# Patient Record
Sex: Male | Born: 2000 | Race: Black or African American | Hispanic: No | Marital: Single | Smoking: Never smoker
Health system: Southern US, Community
[De-identification: ages and names within clinical notes are randomized; demographics above are authoritative.]

## PROBLEM LIST (undated history)

## (undated) HISTORY — PX: APPENDECTOMY: SHX54

---
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
## 2022-02-17 ENCOUNTER — Ambulatory Visit (INDEPENDENT_AMBULATORY_CARE_PROVIDER_SITE_OTHER): Payer: Self-pay

## 2022-02-17 ENCOUNTER — Encounter (HOSPITAL_COMMUNITY): Payer: Self-pay | Admitting: Emergency Medicine

## 2022-02-17 ENCOUNTER — Ambulatory Visit (HOSPITAL_COMMUNITY)
Admission: EM | Admit: 2022-02-17 | Discharge: 2022-02-17 | Disposition: A | Discharge status: Home / Self Care | Payer: Self-pay | Attending: Family Medicine | Admitting: Family Medicine

## 2022-02-17 ENCOUNTER — Other Ambulatory Visit: Payer: Self-pay

## 2022-02-17 DIAGNOSIS — M25572 Pain in left ankle and joints of left foot: Secondary | ICD-10-CM

## 2022-02-17 DIAGNOSIS — S60512A Abrasion of left hand, initial encounter: Secondary | ICD-10-CM

## 2022-02-17 NOTE — ED Provider Notes (Signed)
[facility]    CSN: [id number] Arrival date & time: 02/17/22  1041      History   Chief Complaint Chief Complaint  Patient presents with   Ankle Pain    HPI Jon Short is a 21 y.o. male.   Patient is here for left ankle/foot injury.  He was playing basketball yesterday, his shoe came off and landed oddly.  Not really sure how he landed, but had immediate pain and swelling and could not play.  Today the pain and swelling are worse.  Not really able to bear weight.   While hopping to the car today he fell on gravel and injured the left hand.  Abrasion to the palm.   History reviewed. No pertinent past medical history.  There are no problems to display for this patient.   Past Surgical History:  Procedure Laterality Date   APPENDECTOMY         Home Medications    Prior to Admission medications   Not on File    Family History Family History  Problem Relation Age of Onset   Healthy Mother     Social History Social History   Tobacco Use   Smoking status: Never   Smokeless tobacco: Never  Vaping Use   Vaping Use: Some days  Substance Use Topics   Alcohol use: Never   Drug use: Never     Allergies   Patient has no known allergies.   Review of Systems Review of Systems  Constitutional: Negative.   HENT: Negative.    Respiratory: Negative.    Cardiovascular: Negative.   Gastrointestinal: Negative.   Genitourinary: Negative.     Physical Exam Triage Vital Signs ED Triage Vitals  Enc Vitals Group     BP 02/17/22 1148 128/81     Pulse Rate 02/17/22 1148 85     Resp 02/17/22 1148 20     Temp 02/17/22 1148 97.7 F (36.5 C)     Temp Source 02/17/22 1148 Oral     SpO2 02/17/22 1148 98 %     Weight --      Height --      Head Circumference --      Peak Flow --      Pain Score 02/17/22 1143 3     Pain Loc --      Pain Edu? --      Excl. in GC? --    No data found.  Updated Vital Signs BP 128/81 (BP Location: Left Arm)  Comment (BP Location): large cuff  Pulse 85   Temp 97.7 F (36.5 C) (Oral)   Resp 20   SpO2 98%   Visual Acuity Right Eye Distance:   Left Eye Distance:   Bilateral Distance:    Right Eye Near:   Left Eye Near:    Bilateral Near:     Physical Exam Constitutional:      Appearance: Normal appearance.  Musculoskeletal:     Comments: The left foot and ankle are swollen. TTP to the lateral and medial malleolus;  TTP to the whole foot; no ecchymosis noted;  Decreased rom at the ankle and toes due to pain.   Skin:    Comments: At the left palm are several superficial abrasions;  there is one area that is bleeding minimally, portion of skin removed;   Neurological:     Mental Status: He is alert.  Psychiatric:        Mood and Affect: Mood normal.  UC Treatments / Results  Labs (all labs ordered are listed, but only abnormal results are displayed) Labs Reviewed - No data to display  EKG   Radiology DG Ankle Complete Left  Result Date: 02/17/2022 CLINICAL DATA:  Fall, pain to lateral ankle and foot EXAM: LEFT ANKLE COMPLETE - 3+ VIEW; LEFT FOOT - COMPLETE 3+ VIEW COMPARISON:  None Available. FINDINGS: Ankle: There is no acute fracture or dislocation. Alignment is normal. The ankle mortise is intact. There is no erosive change. There is mild soft tissue swelling over the lateral malleolus. Foot: There is no acute fracture or dislocation. Alignment is normal. The Lisfranc and Chopart joints are intact. The joint spaces are preserved. There is no erosive change. The soft tissues are unremarkable. IMPRESSION: Mild soft tissue swelling over the lateral malleolus. No acute fracture or dislocation in the ankle or foot. Electronically Signed   By: Valetta Mole M.D.   On: 02/17/2022 12:09   DG Foot Complete Left  Result Date: 02/17/2022 CLINICAL DATA:  Fall, pain to lateral ankle and foot EXAM: LEFT ANKLE COMPLETE - 3+ VIEW; LEFT FOOT - COMPLETE 3+ VIEW COMPARISON:  None Available.  FINDINGS: Ankle: There is no acute fracture or dislocation. Alignment is normal. The ankle mortise is intact. There is no erosive change. There is mild soft tissue swelling over the lateral malleolus. Foot: There is no acute fracture or dislocation. Alignment is normal. The Lisfranc and Chopart joints are intact. The joint spaces are preserved. There is no erosive change. The soft tissues are unremarkable. IMPRESSION: Mild soft tissue swelling over the lateral malleolus. No acute fracture or dislocation in the ankle or foot. Electronically Signed   By: Valetta Mole M.D.   On: 02/17/2022 12:09    Procedures Procedures (including critical care time)  Medications Ordered in UC Medications - No data to display  Initial Impression / Assessment and Plan / UC Course  I have reviewed the triage vital signs and the nursing notes.  Pertinent labs & imaging results that were available during my care of the patient were reviewed by me and considered in my medical decision making (see chart for details).    Final Clinical Impressions(s) / UC Diagnoses   Final diagnoses:  Pain of joint of left ankle and foot  Abrasion of palm of left hand, initial encounter     Discharge Instructions      You were seen today for foot and ankle injury.  Your xrays were negative for fracture.  We have given you an ace wrap today.  I recommend you stay off your foot, elevated it, apply ice, an use motrin for pain and swelling.  If you continue with pain and swelling you may need further testing.   Please go to [email or web address]  to find a primary care provider.   The abrasion on your hand was cleaned today and antibiotic ointment applied;  Please keep the area clean and covered.  You may apply over the counter bacitracin as well.     ED Prescriptions   None    PDMP not reviewed this encounter.   Rondel Oh, MD 02/17/22 1220

## 2022-02-17 NOTE — ED Triage Notes (Addendum)
Playing basketball yesterday.  Jumped in air and when he landed, shoe had come off.  Left foot, lateral aspect and lateral left ankle is painful.  Minimal movement of toes possibly secondary to pain, pedal pulse 2 +, visibly swollen.    Today, on his way here, patient fell trying to hop to car.  Landed on left palm on concrete.  Able to move left hand with out any difficulty.  Abrasion to palm

## 2022-02-17 NOTE — Discharge Instructions (Addendum)
You were seen today for foot and ankle injury.  Your xrays were negative for fracture.  We have given you an ace wrap today.  I recommend you stay off your foot, elevated it, apply ice, an use motrin for pain and swelling.  If you continue with pain and swelling you may need further testing.   Please go to [email or web address]  to find a primary care provider.   The abrasion on your hand was cleaned today and antibiotic ointment applied;  Please keep the area clean and covered.  You may apply over the counter bacitracin as well.

## 2022-07-25 ENCOUNTER — Encounter (HOSPITAL_COMMUNITY): Payer: Self-pay

## 2022-07-25 ENCOUNTER — Ambulatory Visit (INDEPENDENT_AMBULATORY_CARE_PROVIDER_SITE_OTHER): Payer: Self-pay

## 2022-07-25 ENCOUNTER — Ambulatory Visit (HOSPITAL_COMMUNITY)
Admission: EM | Admit: 2022-07-25 | Discharge: 2022-07-25 | Disposition: A | Discharge status: Home / Self Care | Payer: Self-pay | Attending: Family Medicine | Admitting: Family Medicine

## 2022-07-25 DIAGNOSIS — Y9361 Activity, american tackle football: Secondary | ICD-10-CM

## 2022-07-25 DIAGNOSIS — M25512 Pain in left shoulder: Secondary | ICD-10-CM

## 2022-07-25 MED ORDER — IBUPROFEN 800 MG PO TABS: 800.0000 mg | ORAL_TABLET | Freq: Three times a day (TID) | ORAL | 0 refills | Status: DC

## 2022-07-25 MED ORDER — TIZANIDINE HCL 4 MG PO TABS: 4.0000 mg | ORAL_TABLET | Freq: Four times a day (QID) | ORAL | 0 refills | Status: AC | PRN

## 2022-07-25 MED ORDER — KETOROLAC TROMETHAMINE 30 MG/ML IJ SOLN
30.0000 mg | Freq: Once | INTRAMUSCULAR | Status: AC
  Administered 2022-07-25: 30 mg via INTRAMUSCULAR

## 2022-07-25 MED ORDER — KETOROLAC TROMETHAMINE 30 MG/ML IJ SOLN
INTRAMUSCULAR | Status: AC
  Filled 2022-07-25: qty 1

## 2022-07-25 NOTE — ED Triage Notes (Signed)
Patient states he was hit while playing football and did not think anything of it. Last night pain in the left shoulder started, shooting pain when turning his head.  States this morning could not move neck or arm.  No previous injuries to the left shoulder.

## 2022-07-25 NOTE — ED Provider Notes (Signed)
[facility]    CSN: [id number] Arrival date & time: 07/25/22  0847      History   Chief Complaint Chief Complaint  Patient presents with   Shoulder Injury    HPI Jon Short is a 21 y.o. male.   He was hit playing football on Friday (3 days ago).   He was running and his cousin hit his shoulder from the front.  No immediate at that time.  He thinks it started hurting the next day.   It has gotten worse, this morning was painful to move the neck.  He is having pain around the whole left shoulder, and top of the shoulder.  He gets sharp pains at times at the shoulder with certain movements.  Pain if just hanging down.  He did take motrin for pain without much help.  He is Nature conservation officer at [employer].  He is right handed.        History reviewed. No pertinent past medical history.  There are no problems to display for this patient.   Past Surgical History:  Procedure Laterality Date   APPENDECTOMY         Home Medications    Prior to Admission medications   Not on File    Family History Family History  Problem Relation Age of Onset   Healthy Mother     Social History Social History   Tobacco Use   Smoking status: Never   Smokeless tobacco: Never  Vaping Use   Vaping Use: Some days  Substance Use Topics   Alcohol use: Never   Drug use: Never     Allergies   Patient has no known allergies.   Review of Systems Review of Systems  Constitutional: Negative.   HENT: Negative.    Respiratory: Negative.    Cardiovascular: Negative.   Gastrointestinal: Negative.   Genitourinary: Negative.   Skin: Negative.   Psychiatric/Behavioral: Negative.       Physical Exam Triage Vital Signs ED Triage Vitals  Enc Vitals Group     BP 07/25/22 0947 122/64     Pulse Rate 07/25/22 0947 71     Resp 07/25/22 0947 16     Temp 07/25/22 0947 98.1 F (36.7 C)     Temp Source 07/25/22 0947 Oral     SpO2 07/25/22 0947 98 %     Weight --      Height --       Head Circumference --      Peak Flow --      Pain Score 07/25/22 0946 10     Pain Loc --      Pain Edu? --      Excl. in GC? --    No data found.  Updated Vital Signs BP 122/64 (BP Location: Right Arm)   Pulse 71   Temp 98.1 F (36.7 C) (Oral)   Resp 16   SpO2 98%   Visual Acuity Right Eye Distance:   Left Eye Distance:   Bilateral Distance:    Right Eye Near:   Left Eye Near:    Bilateral Near:     Physical Exam Constitutional:      Appearance: Normal appearance.  Musculoskeletal:     Comments: Holds the left arm/shoulder near his left side without much movement.  TTP to the left trapezius from the neck to the mid back;  TTP to the anterior left shoulder and superior shoulder. Decreased ROM due to pain.  Exam is limited by the  amount of pain he is in  Skin:    General: Skin is warm.  Neurological:     General: No focal deficit present.     Mental Status: He is alert.  Psychiatric:        Mood and Affect: Mood normal.      UC Treatments / Results  Labs (all labs ordered are listed, but only abnormal results are displayed) Labs Reviewed - No data to display  EKG   Radiology DG Shoulder Left  Result Date: 07/25/2022 CLINICAL DATA:  Left shoulder pain following football injury EXAM: LEFT SHOULDER - 4 VIEW COMPARISON:  None Available. FINDINGS: There is no evidence of fracture or dislocation. There is no evidence of arthropathy or other focal bone abnormality. Soft tissues are unremarkable. IMPRESSION: No acute fracture or dislocation. Electronically Signed   By: Darrin Nipper M.D.   On: 07/25/2022 10:51    Procedures Procedures (including critical care time)  Medications Ordered in UC Medications - No data to display  Initial Impression / Assessment and Plan / UC Course  I have reviewed the triage vital signs and the nursing notes.  Pertinent labs & imaging results that were available during my care of the patient were reviewed by me and considered in  my medical decision making (see chart for details).     Final Clinical Impressions(s) / UC Diagnoses   Final diagnoses:  Pain in joint of left shoulder     Discharge Instructions      You were seen today for shoulder pain/injury.  Your xray was negative for fracture or dislocation.  You may have more muscular injury.  I am treating you with a muscle relaxer and high dose motrin to help with pain.  The muscle relaxer may make you sleepy so please take this when you are home and not driving.  You should keep you arm in the sling for comfort.  I recommend you use heat or ice at the shoulder/back, which ever feel better to you.  I am giving you off of work the next several days.  If not improving, then I recommend you see a primary care provider for further care and discussion.  You may go to [email or web address] to find one.     ED Prescriptions     Medication Sig Dispense Auth. Provider   tiZANidine (ZANAFLEX) 4 MG tablet Take 1 tablet (4 mg total) by mouth every 6 (six) hours as needed for muscle spasms. 30 tablet Sinia Antosh, MD   ibuprofen (ADVIL) 800 MG tablet Take 1 tablet (800 mg total) by mouth 3 (three) times daily. 21 tablet Rondel Oh, MD      PDMP not reviewed this encounter.   Rondel Oh, MD 07/25/22 1058

## 2022-07-25 NOTE — Discharge Instructions (Addendum)
You were seen today for shoulder pain/injury.  Your xray was negative for fracture or dislocation.  You may have more muscular injury.  I am treating you with a muscle relaxer and high dose motrin to help with pain.  The muscle relaxer may make you sleepy so please take this when you are home and not driving.  You should keep you arm in the sling for comfort.  I recommend you use heat or ice at the shoulder/back, which ever feel better to you.  I am giving you off of work the next several days.  If not improving, then I recommend you see a primary care provider for further care and discussion.  You may go to [email or web address] to find one.

## 2022-11-10 ENCOUNTER — Ambulatory Visit (HOSPITAL_COMMUNITY)
Admission: EM | Admit: 2022-11-10 | Discharge: 2022-11-10 | Disposition: A | Discharge status: Home / Self Care | Payer: Self-pay | Attending: Internal Medicine | Admitting: Internal Medicine

## 2022-11-10 ENCOUNTER — Encounter (HOSPITAL_COMMUNITY): Payer: Self-pay | Admitting: Emergency Medicine

## 2022-11-10 ENCOUNTER — Other Ambulatory Visit: Payer: Self-pay

## 2022-11-10 DIAGNOSIS — Z20822 Contact with and (suspected) exposure to covid-19: Secondary | ICD-10-CM

## 2022-11-10 DIAGNOSIS — Z1152 Encounter for screening for COVID-19: Secondary | ICD-10-CM | POA: Insufficient documentation

## 2022-11-10 LAB — SARS CORONAVIRUS 2 (TAT 6-24 HRS): SARS Coronavirus 2: NEGATIVE

## 2022-11-10 NOTE — ED Triage Notes (Signed)
Requesting test for covid.  Denies any symptoms.  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

## 2022-11-10 NOTE — Discharge Instructions (Signed)
COVID-19 testing is pending. We will call you with results if positive. If your COVID test is positive, you must stay at home until day 6 of symptoms. On day 6, you may go out into public and go back to work, but you must wear a mask until day 11 of symptoms to prevent spread to others.  Return to urgent care as needed.

## 2022-11-10 NOTE — ED Notes (Signed)
Covid test obtained labeled at bedside and placed in lab

## 2022-11-11 NOTE — ED Provider Notes (Signed)
[facility]    CSN: [id number] Arrival date & time: 11/10/22  1119      History   Chief Complaint Chief Complaint  Patient presents with   covid test    HPI Jon Short is a 22 y.o. male.   Patient presents urgent care for COVID-19 testing since he was recently exposed to someone in his household (mother) who recently tested positive for COVID-19.  He is not currently experiencing any symptoms but would like to be tested.  No history of chronic respiratory problems.  He states that he vapes sometimes, however not every day.  She denies cigarette and other drug use.  Currently without any other complaints.     History reviewed. No pertinent past medical history.  There are no problems to display for this patient.   Past Surgical History:  Procedure Laterality Date   APPENDECTOMY         Home Medications    Prior to Admission medications   Medication Sig Start Date End Date Taking? Authorizing Provider  ibuprofen (ADVIL) 800 MG tablet Take 1 tablet (800 mg total) by mouth 3 (three) times daily. 07/25/22   Piontek, Junie Panning, MD  tiZANidine (ZANAFLEX) 4 MG tablet Take 1 tablet (4 mg total) by mouth every 6 (six) hours as needed for muscle spasms. 07/25/22   Rondel Oh, MD    Family History Family History  Problem Relation Age of Onset   Healthy Mother     Social History Social History   Tobacco Use   Smoking status: Never   Smokeless tobacco: Never  Vaping Use   Vaping Use: Some days  Substance Use Topics   Alcohol use: Never   Drug use: Never     Allergies   Patient has no known allergies.   Review of Systems Review of Systems Per HPI  Physical Exam Triage Vital Signs ED Triage Vitals  Enc Vitals Group     BP 11/10/22 1209 129/83     Pulse Rate 11/10/22 1209 69     Resp 11/10/22 1209 18     Temp 11/10/22 1209 97.9 F (36.6 C)     Temp Source 11/10/22 1209 Oral     SpO2 11/10/22 1209 98 %     Weight --      Height --       Head Circumference --      Peak Flow --      Pain Score 11/10/22 1208 0     Pain Loc --      Pain Edu? --      Excl. in [place]? --    No data found.  Updated Vital Signs BP 129/83 (BP Location: Left Arm)   Pulse 69   Temp 97.9 F (36.6 C) (Oral)   Resp 18   SpO2 98%   Visual Acuity Right Eye Distance:   Left Eye Distance:   Bilateral Distance:    Right Eye Near:   Left Eye Near:    Bilateral Near:     Physical Exam Vitals and nursing note reviewed.  Constitutional:      Appearance: He is not ill-appearing or toxic-appearing.  HENT:     Head: Normocephalic and atraumatic.     Right Ear: Hearing and external ear normal.     Left Ear: Hearing and external ear normal.     Nose: Nose normal.     Mouth/Throat:     Lips: Pink.  Eyes:     General: Lids are normal.  Vision grossly intact. Gaze aligned appropriately.     Extraocular Movements: Extraocular movements intact.     Conjunctiva/sclera: Conjunctivae normal.  Cardiovascular:     Rate and Rhythm: Normal rate and regular rhythm.     Heart sounds: Normal heart sounds, S1 normal and S2 normal.  Pulmonary:     Effort: Pulmonary effort is normal. No respiratory distress.     Breath sounds: Normal breath sounds and air entry.  Musculoskeletal:     Cervical back: Neck supple.  Skin:    General: Skin is warm and dry.     Capillary Refill: Capillary refill takes less than 2 seconds.     Findings: No rash.  Neurological:     General: No focal deficit present.     Mental Status: He is alert and oriented to person, place, and time. Mental status is at baseline.     Cranial Nerves: No dysarthria or facial asymmetry.  Psychiatric:        Mood and Affect: Mood normal.        Speech: Speech normal.        Behavior: Behavior normal.        Thought Content: Thought content normal.        Judgment: Judgment normal.      UC Treatments / Results  Labs (all labs ordered are listed, but only abnormal results are  displayed) Labs Reviewed  SARS CORONAVIRUS 2 (TAT 6-24 HRS)    EKG   Radiology No results found.  Procedures Procedures (including critical care time)  Medications Ordered in UC Medications - No data to display  Initial Impression / Assessment and Plan / UC Course  I have reviewed the triage vital signs and the nursing notes.  Pertinent labs & imaging results that were available during my care of the patient were reviewed by me and considered in my medical decision making (see chart for details).   1.  Encounter for screening for COVID-19 COVID testing is pending, will call patient if positive.  He is not a candidate for antiviral therapy.  Quarantine guidelines discussed should he test positive for COVID-19.   Discussed physical exam and available lab work findings in clinic with patient.  Counseled patient regarding appropriate use of medications and potential side effects for all medications recommended or prescribed today. Discussed red flag signs and symptoms of worsening condition,when to call the PCP office, return to urgent care, and when to seek higher level of care in the emergency department. Patient verbalizes understanding and agreement with plan. All questions answered. Patient discharged in stable condition.    Final Clinical Impressions(s) / UC Diagnoses   Final diagnoses:  Encounter for screening for COVID-19     Discharge Instructions      COVID-19 testing is pending. We will call you with results if positive. If your COVID test is positive, you must stay at home until day 6 of symptoms. On day 6, you may go out into public and go back to work, but you must wear a mask until day 11 of symptoms to prevent spread to others.  Return to urgent care as needed.   ED Prescriptions   None    PDMP not reviewed this encounter.   Talbot Grumbling, [place] 11/11/22 1926

## 2022-12-21 ENCOUNTER — Encounter (HOSPITAL_COMMUNITY): Payer: Self-pay

## 2022-12-21 ENCOUNTER — Ambulatory Visit (INDEPENDENT_AMBULATORY_CARE_PROVIDER_SITE_OTHER): Payer: No Payment, Other | Admitting: Clinical

## 2022-12-21 DIAGNOSIS — F411 Generalized anxiety disorder: Secondary | ICD-10-CM

## 2022-12-21 DIAGNOSIS — F1221 Cannabis dependence, in remission: Secondary | ICD-10-CM
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
## 2023-01-05 ENCOUNTER — Ambulatory Visit (HOSPITAL_COMMUNITY): Payer: No Payment, Other | Admitting: Student

## 2023-01-12 ENCOUNTER — Ambulatory Visit (HOSPITAL_COMMUNITY): Payer: No Payment, Other | Admitting: Clinical

## 2023-01-12 DIAGNOSIS — F411 Generalized anxiety disorder: Secondary | ICD-10-CM

## 2023-01-12 NOTE — Progress Notes (Signed)
THERAPIST PROGRESS NOTE Virtual Visit via Video Note  I connected with Jon Short on 01/12/23 at  9:00 AM EDT by a video enabled telemedicine application and verified that I am speaking with the correct person using two identifiers.  Location: Patient: home Provider: office   I discussed the limitations of evaluation and management by telemedicine and the availability of in person appointments. The patient expressed understanding and agreed to proceed.   Follow Up Instructions: I discussed the assessment and treatment plan with the patient. The patient was provided an opportunity to ask questions and all were answered. The patient agreed with the plan and demonstrated an understanding of the instructions.   The patient was advised to call back or seek an in-person evaluation if the symptoms worsen or if the condition fails to improve as anticipated.   Session Time: 20 minutes  Participation Level: Active  Behavioral Response: CasualAlertEuthymic  Type of Therapy: Individual Therapy  Treatment Goals addressed: client will score less than a 5 on the generalized anxiety disorder 7 scale  ProgressTowards Goals: Progressing  Interventions: CBT and Supportive  Summary:  Jon Short is a 22 y.o. male who presents for the scheduled appointment oriented x 5, appropriately dressed, and friendly.  Client denied hallucinations and delusions. Client reported on today he is doing pretty well.  Client reported he did recently have a "episode".  Client reported he was client initially and was having a conversation with his mother when he had a sensation of not being sure if it was his voice he was hearing.  Client reported he had a feeling that others were against him and he localized to wanting to be left alone and by himself for the rest of the day.  Client reported the situation did not escalate at all but he needed to be by himself.  Client reported he is not completely sure if it is  auditory hallucinations coming back as he has previously experienced years ago but he does want to speak to a psychiatrist.  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  Client reported he continues to have anxiety symptoms of visible shakiness.  Client reported it can occur without trigger. Evidence of progress towards goal: Client reported to possible symptoms that could be early warning signs of previous mental health symptoms he experienced years before.  Suicidal/Homicidal: Nowithout intent/plan  Therapist Response:  Therapist began the appointment asking the client how he has been doing since last seen. Therapist used CBT to engage using active listening and positive emotional support towards his thoughts and feelings. Therapist used CBT to ask the client to identify issues with anxiety, depression and/or irritability. Therapist used CBT to ask client clarifying questions to ensure describe symptoms have not caused him to have negative thoughts and feelings about her harming himself and/or other people. Therapist used CBT to discuss positive self-care activities and positively reinforcing using his interest of supports to keep them positively engaged. Therapist used CBT ask the client to identify his progress with frequency of use with coping skills with continued practice in his daily activity.    Therapist assigned client homework to practice positive self-care activities.   Plan: Return again in 3 weeks.  Diagnosis: Generalized anxiety disorder  Collaboration of Care: Other client requested the therapist  fax an appointment confirmation sheet to his court counselor.  Patient/Guardian was advised Release of Information must be obtained prior to any record release in order to  collaborate their care with an outside provider. Patient/Guardian was advised if they have not already done so to contact the registration department to sign all necessary forms in order for [place] to release information regarding their care.   Consent: Patient/Guardian gives verbal consent for treatment and assignment of benefits for services provided during this visit. Patient/Guardian expressed understanding and agreed to proceed.   Neena Rhymes Shequita Peplinski, LCSW 01/12/2023

## 2023-01-26 ENCOUNTER — Ambulatory Visit (HOSPITAL_COMMUNITY): Payer: No Payment, Other | Admitting: Clinical

## 2023-01-26 DIAGNOSIS — F411 Generalized anxiety disorder: Secondary | ICD-10-CM
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
## 2023-02-08 NOTE — Progress Notes (Deleted)
Psychiatric Initial Adult Assessment  Patient Identification: Jon Short MRN:  [id number] Date of Evaluation:  02/08/2023 Referral Source: ***  Assessment:  Jon Short is a 22 y.o. male with a history of ADHD, ODD, paranoid schizophrenia, and bipolar disorder who presents in person to [facility] for initial evaluation of hallucinations.  Patient reports ***  Plan:  # *** Past medication trials:  Status of problem: *** Interventions: -- ***  # *** Past medication trials:  Status of problem: *** Interventions: -- ***  # *** Past medication trials:  Status of problem: *** Interventions: -- ***  Patient was given contact information for behavioral health clinic and was instructed to call 911 for emergencies.   Subjective:  Chief Complaint: No chief complaint on file.   History of Present Illness:  ***  Past Psychiatric History:  Diagnoses: *** Medication trials: *** Previous psychiatrist/therapist: *** Hospitalizations: *** Suicide attempts: *** SIB: *** Hx of violence towards others: *** Current access to guns: *** Hx of trauma/abuse: ***  Substance Abuse History in the last 12 months:  {yes no:314532}  Past Medical History: No past medical history on file.  Past Surgical History:  Procedure Laterality Date   APPENDECTOMY      Family Psychiatric History: ***  Family History:  Family History  Problem Relation Age of Onset   Healthy Mother     Social History:   Social History   Socioeconomic History   Marital status: Single    Spouse name: Not on file   Number of children: Not on file   Years of education: Not on file   Highest education level: Not on file  Occupational History   Not on file  Tobacco Use   Smoking status: Never   Smokeless tobacco: Never  Vaping Use   Vaping Use: Some days  Substance and Sexual Activity   Alcohol use: Never   Drug use: Never   Sexual activity: Not on file  Other Topics Concern    Not on file  Social History Narrative   Not on file   Social Determinants of Health   Financial Resource Strain: Not on file  Food Insecurity: Not on file  Transportation Needs: Not on file  Physical Activity: Not on file  Stress: Not on file  Social Connections: Not on file    Additional Social History: updated  Allergies:  No Known Allergies  Current Medications: Current Outpatient Medications  Medication Sig Dispense Refill   ibuprofen (ADVIL) 800 MG tablet Take 1 tablet (800 mg total) by mouth 3 (three) times daily. 21 tablet 0   tiZANidine (ZANAFLEX) 4 MG tablet Take 1 tablet (4 mg total) by mouth every 6 (six) hours as needed for muscle spasms. 30 tablet 0   No current facility-administered medications for this visit.    ROS: Review of Systems  Objective:  Psychiatric Specialty Exam: There were no vitals taken for this visit.There is no height or weight on file to calculate BMI.  General Appearance: {Appearance:22683}  Eye Contact:  {BHH EYE CONTACT:22684}  Speech:  {Speech:22685}  Volume:  {Volume (PAA):22686}  Mood:  {BHH MOOD:22306}  Affect:  {Affect (PAA):22687}  Thought Content: {Thought Content:22690}   Suicidal Thoughts:  {ST/HT (PAA):22692}  Homicidal Thoughts:  {ST/HT (PAA):22692}  Thought Process:  {Thought Process (PAA):22688}  Orientation:  {BHH ORIENTATION (PAA):22689}    Memory:  Grossly intact ***  Judgment:  {Judgement (PAA):22694}  Insight:  {Insight (PAA):22695}  Concentration:  {Concentration:21399}  Recall:  not formally assessed ***  Fund of Knowledge: {BHH GOOD/FAIR/POOR:22877}  Language: {BHH GOOD/FAIR/POOR:22877}  Psychomotor Activity:  {Psychomotor (PAA):22696}  Akathisia:  {BHH YES OR NO:22294}  AIMS (if indicated): {Desc; done/not:10129}  Assets:  {Assets (PAA):22698}  ADL's:  {BHH ZHY'Q:65784}  Cognition: {chl bhh cognition:304700322}  Sleep:  {BHH GOOD/FAIR/POOR:22877}   PE: General: well-appearing; no acute distress  *** Pulm: no increased work of breathing on room air *** Strength & Muscle Tone: {desc; muscle tone:32375} Neuro: no focal neurological deficits observed *** Gait & Station: {PE GAIT ED NATL:22525}  Metabolic Disorder Labs: No results found for: "HGBA1C", "MPG" No results found for: "PROLACTIN" No results found for: "CHOL", "TRIG", "HDL", "CHOLHDL", "VLDL", "LDLCALC" No results found for: "TSH"  Therapeutic Level Labs: No results found for: "LITHIUM" No results found for: "CBMZ" No results found for: "VALPROATE"  Screenings:  GAD-7    Flowsheet Row Counselor from 01/26/2023 in [facility] Counselor from 12/21/2022 in [facility]  Total GAD-7 Score 19 3      PHQ2-9    Flowsheet Row Counselor from 12/21/2022 in [place]  PHQ-2 Total Score 0  PHQ-9 Total Score 0      Flowsheet Row Counselor from 12/21/2022 in [facility] ED from 11/10/2022 in [facility] ED from 07/25/2022 in [facility] RISK CATEGORY No Risk No Risk No Risk       Collaboration of Care: Collaboration of Care: [facility] OP Collaboration of Care:21014065}  Patient/Guardian was advised Release of Information must be obtained prior to any record release in order to collaborate their care with an outside provider. Patient/Guardian was advised if they have not already done so to contact the registration department to sign all necessary forms in order for [place] to release information regarding their care.   Consent: Patient/Guardian gives verbal consent for treatment and assignment of benefits for services provided during this visit. Patient/Guardian expressed understanding and agreed to proceed.   A total of *** minutes was spent involved in face to face clinical care, chart review, documentation, and ***.   Park Pope, MD 5/8/20242:13 PM

## 2023-02-09 ENCOUNTER — Ambulatory Visit (HOSPITAL_COMMUNITY): Payer: No Payment, Other | Admitting: Student

## 2023-02-09 ENCOUNTER — Encounter (HOSPITAL_COMMUNITY): Payer: Self-pay

## 2023-02-22 ENCOUNTER — Ambulatory Visit (INDEPENDENT_AMBULATORY_CARE_PROVIDER_SITE_OTHER): Payer: No Payment, Other | Admitting: Clinical

## 2023-02-22 DIAGNOSIS — F411 Generalized anxiety disorder: Secondary | ICD-10-CM | POA: Diagnosis not present
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
## 2023-03-09 ENCOUNTER — Ambulatory Visit (HOSPITAL_COMMUNITY): Payer: No Payment, Other | Admitting: Clinical

## 2023-03-10 ENCOUNTER — Ambulatory Visit (HOSPITAL_COMMUNITY): Admission: EM | Admit: 2023-03-10 | Discharge: 2023-03-10 | Discharge status: Against Medical Advice | Payer: Self-pay

## 2023-03-10 ENCOUNTER — Telehealth (HOSPITAL_COMMUNITY): Payer: Self-pay | Admitting: Clinical

## 2023-03-10 NOTE — Telephone Encounter (Signed)
Therapist sent the client a link for the scheduled virtual visit. Client did not check in using the link.therapist attempted to call the client via phone. Client did not answer. Therapist left a message for him to call and reschedule.

## 2023-03-10 NOTE — ED Notes (Signed)
Informed by front desk staff that Patient LWBS before triage.  

## 2023-03-11 ENCOUNTER — Ambulatory Visit (HOSPITAL_COMMUNITY): Payer: Self-pay

## 2023-03-23 ENCOUNTER — Ambulatory Visit (HOSPITAL_COMMUNITY): Payer: No Payment, Other | Admitting: Student

## 2023-05-24 ENCOUNTER — Other Ambulatory Visit: Payer: Self-pay

## 2023-05-24 ENCOUNTER — Emergency Department (HOSPITAL_COMMUNITY)
Admission: EM | Admit: 2023-05-24 | Discharge: 2023-05-25 | Disposition: A | Discharge status: Home / Self Care | Payer: Self-pay | Attending: Emergency Medicine | Admitting: Emergency Medicine

## 2023-05-24 ENCOUNTER — Emergency Department (HOSPITAL_COMMUNITY): Payer: Self-pay

## 2023-05-24 DIAGNOSIS — W231XXA Caught, crushed, jammed, or pinched between stationary objects, initial encounter: Secondary | ICD-10-CM | POA: Insufficient documentation

## 2023-05-24 DIAGNOSIS — Y990 Civilian activity done for income or pay: Secondary | ICD-10-CM | POA: Insufficient documentation

## 2023-05-24 DIAGNOSIS — Y92481 Parking lot as the place of occurrence of the external cause: Secondary | ICD-10-CM | POA: Insufficient documentation

## 2023-05-24 DIAGNOSIS — S8001XA Contusion of right knee, initial encounter: Secondary | ICD-10-CM

## 2023-05-24 DIAGNOSIS — S81001A Unspecified open wound, right knee, initial encounter: Secondary | ICD-10-CM | POA: Insufficient documentation
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
## 2023-05-25 NOTE — Discharge Instructions (Signed)
Elevate your leg and apply ice for 20 minutes at a time to help with pain and swelling.  You can take Motrin and Tylenol as needed for pain.  Apply Bacitracin to your wound twice daily.  Recheck with your worker's comp provider in 2 days.

## 2023-05-29 ENCOUNTER — Ambulatory Visit (HOSPITAL_COMMUNITY): Payer: No Payment, Other | Admitting: Clinical

## 2023-05-29 DIAGNOSIS — F411 Generalized anxiety disorder: Secondary | ICD-10-CM
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
## 2023-05-30 ENCOUNTER — Encounter (HOSPITAL_COMMUNITY): Payer: Self-pay | Admitting: Psychiatry

## 2023-05-30 ENCOUNTER — Ambulatory Visit (INDEPENDENT_AMBULATORY_CARE_PROVIDER_SITE_OTHER): Payer: No Payment, Other | Admitting: Psychiatry

## 2023-05-30 VITALS — BP 111/98 | HR 55 | Temp 98.4°F | Ht 72.0 in | Wt 202.8 lb

## 2023-05-30 DIAGNOSIS — F4310 Post-traumatic stress disorder, unspecified: Secondary | ICD-10-CM | POA: Diagnosis not present

## 2023-05-30 DIAGNOSIS — F250 Schizoaffective disorder, bipolar type: Secondary | ICD-10-CM

## 2023-05-30 DIAGNOSIS — F411 Generalized anxiety disorder: Secondary | ICD-10-CM

## 2023-05-30 MED ORDER — HYDROXYZINE HCL 10 MG PO TABS: 10.0000 mg | ORAL_TABLET | Freq: Three times a day (TID) | ORAL | 0 refills | Status: DC | PRN

## 2023-05-30 MED ORDER — HYDROXYZINE HCL 10 MG PO TABS: 10.0000 mg | ORAL_TABLET | Freq: Three times a day (TID) | ORAL | 3 refills | Status: AC | PRN

## 2023-05-30 MED ORDER — LURASIDONE HCL 20 MG PO TABS: 20.0000 mg | ORAL_TABLET | Freq: Every evening | ORAL | 3 refills | Status: AC

## 2023-05-30 MED ORDER — TRAZODONE HCL 50 MG PO TABS: 50.0000 mg | ORAL_TABLET | Freq: Every day | ORAL | 3 refills | Status: AC
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
## 2023-06-27 ENCOUNTER — Ambulatory Visit (HOSPITAL_COMMUNITY): Payer: No Payment, Other | Admitting: Clinical

## 2023-06-27 ENCOUNTER — Telehealth (HOSPITAL_COMMUNITY): Payer: Self-pay | Admitting: Clinical

## 2023-06-27 NOTE — Telephone Encounter (Signed)
Therapist attempted to call the clients telephone in regards to missed in person appointment. Therapist attempt was not successful. Client phone stated it was not a valid number. Therapist unable to leave a voicemail.

## 2023-07-04 LAB — URINE DRUG PANEL 7
Amphetamines, Urine: NEGATIVE ng/mL
Barbiturate Quant, Ur: NEGATIVE ng/mL
Benzodiazepine Quant, Ur: NEGATIVE ng/mL
Cannabinoid Quant, Ur: NEGATIVE ng/mL
Cocaine (Metab.): NEGATIVE ng/mL
Opiate Quant, Ur: NEGATIVE ng/mL
PCP Quant, Ur: NEGATIVE ng/mL

## 2023-07-05 ENCOUNTER — Telehealth (HOSPITAL_COMMUNITY): Payer: Self-pay | Admitting: Clinical

## 2023-07-06 ENCOUNTER — Telehealth (HOSPITAL_COMMUNITY): Payer: Self-pay | Admitting: Clinical

## 2023-07-06 NOTE — Progress Notes (Signed)
Provider discussed patient's urine drug screen with him.  He notes that he would like it to faxed to [place] for his pretrial intervention.  Provider informed patient that he would need to sign a release of information form.  He acknowledged understanding and agreed.  No other concerns noted at this time.

## 2023-07-06 NOTE — Telephone Encounter (Signed)
Therapist returned phone call to this client.  Client reported he needs his last note from therapy and psychiatry to see into his mental health court counselor.  Therapist informed the client I will speak with the psychiatrist about collectively sending the information.  Therapist reports she will get back in touch with the client once that has been completed.  Client reported no other concerns.

## 2023-07-07 ENCOUNTER — Telehealth (HOSPITAL_COMMUNITY): Payer: Self-pay | Admitting: Clinical

## 2023-07-07 NOTE — Telephone Encounter (Signed)
Therapist called the client back about his inquiry to have his last therapy and psychiatry notes sent to his lawyer. Therapist faxed the requested information. Client did sign a release of information for his lawyer.

## 2023-07-18 ENCOUNTER — Ambulatory Visit (HOSPITAL_COMMUNITY): Payer: No Payment, Other | Admitting: Clinical

## 2023-08-02 ENCOUNTER — Encounter (HOSPITAL_COMMUNITY): Payer: No Payment, Other | Admitting: Psychiatry

## 2023-08-03 ENCOUNTER — Encounter (HOSPITAL_COMMUNITY): Payer: No Payment, Other | Admitting: Psychiatry

## 2023-09-24 ENCOUNTER — Ambulatory Visit (HOSPITAL_COMMUNITY)
Admission: EM | Admit: 2023-09-24 | Discharge: 2023-09-24 | Disposition: A | Discharge status: Home / Self Care | Payer: Self-pay | Attending: Internal Medicine | Admitting: Internal Medicine

## 2023-09-24 ENCOUNTER — Ambulatory Visit (INDEPENDENT_AMBULATORY_CARE_PROVIDER_SITE_OTHER): Payer: Self-pay

## 2023-09-24 ENCOUNTER — Encounter (HOSPITAL_COMMUNITY): Payer: Self-pay | Admitting: Emergency Medicine

## 2023-09-24 DIAGNOSIS — B070 Plantar wart: Secondary | ICD-10-CM

## 2023-09-24 MED ORDER — SALICYLIC ACID 27.5 % EX LIQD: CUTANEOUS | 0 refills | Status: AC

## 2023-09-24 NOTE — Discharge Instructions (Signed)
Please soak your feet in water for about 10 to 15 minutes, dry the area and then apply the medications prescribed. You may take ibuprofen as needed for pain Please wear well-padded shoes to help reduce the level of pain If you have persistent pain or worsening symptoms, you will need to see a podiatrist for further management. Feel free to return to urgent care if you have worsening symptoms.

## 2023-09-24 NOTE — ED Triage Notes (Signed)
Pt presents for right foot pain. States he stepped on an earring about 4 months ago and now has a spot on bottom of foot that is causing pain.

## 2023-09-24 NOTE — ED Provider Notes (Signed)
[facility]    CSN: [id number] Arrival date & time: 09/24/23  1108      History   Chief Complaint Chief Complaint  Patient presents with   Foot Injury    HPI Jon Short is a 22 y.o. male comes to the urgent care with right foot pain.  Patient symptoms started 4 months ago.  He recollects stepping on an earring prior to the onset of the pain.  Patient does not remember if part of the earring broke off when he stepped on it.  No fever or chills.  Pain is of moderate severity, sharp and aggravated by bearing weight.  No swelling or discharge from the area.   HPI  History reviewed. No pertinent past medical history.  There are no active problems to display for this patient.   Past Surgical History:  Procedure Laterality Date   APPENDECTOMY         Home Medications    Prior to Admission medications   Medication Sig Start Date End Date Taking? Authorizing Provider  Salicylic Acid 27.5 % LIQD Please apply solution to the affected area twice daily. 09/24/23  Yes Conni Knighton, Britta Mccreedy, MD  hydrOXYzine (ATARAX) 10 MG tablet Take 1 tablet (10 mg total) by mouth 3 (three) times daily as needed. Patient not taking: Reported on 09/24/2023 05/30/23   Shanna Cisco, NP  ibuprofen (ADVIL) 800 MG tablet Take 1 tablet (800 mg total) by mouth 3 (three) times daily. 07/25/22   Piontek, Denny Peon, MD  lurasidone (LATUDA) 20 MG TABS tablet Take 1 tablet (20 mg total) by mouth at bedtime. 05/30/23   Shanna Cisco, NP  tiZANidine (ZANAFLEX) 4 MG tablet Take 1 tablet (4 mg total) by mouth every 6 (six) hours as needed for muscle spasms. Patient not taking: Reported on 09/24/2023 07/25/22   Jannifer Franklin, MD  traZODone (DESYREL) 50 MG tablet Take 1 tablet (50 mg total) by mouth at bedtime. Patient not taking: Reported on 09/24/2023 05/30/23   Shanna Cisco, NP    Family History Family History  Problem Relation Age of Onset   Healthy Mother     Social History Social  History   Tobacco Use   Smoking status: Never   Smokeless tobacco: Never  Vaping Use   Vaping status: Some Days  Substance Use Topics   Alcohol use: Never   Drug use: Never     Allergies   Patient has no known allergies.   Review of Systems Review of Systems As per HPI  Physical Exam Triage Vital Signs ED Triage Vitals  Encounter Vitals Group     BP 09/24/23 1156 122/78     Systolic BP Percentile --      Diastolic BP Percentile --      Pulse Rate 09/24/23 1156 61     Resp 09/24/23 1156 16     Temp 09/24/23 1156 98 F (36.7 C)     Temp Source 09/24/23 1156 Oral     SpO2 09/24/23 1156 98 %     Weight --      Height --      Head Circumference --      Peak Flow --      Pain Score 09/24/23 1155 6     Pain Loc --      Pain Education --      Exclude from Growth Chart --    No data found.  Updated Vital Signs BP 122/78 (BP Location: Left Arm)  Pulse 61   Temp 98 F (36.7 C) (Oral)   Resp 16   SpO2 98%   Visual Acuity Right Eye Distance:   Left Eye Distance:   Bilateral Distance:    Right Eye Near:   Left Eye Near:    Bilateral Near:     Physical Exam Vitals and nursing note reviewed.  Constitutional:      Appearance: Normal appearance.  Musculoskeletal:        General: Normal range of motion.  Skin:    Comments: Plantar wart seen on the right heel.  No bleeding.  Tender to palpation.  Neurological:     Mental Status: He is alert.      UC Treatments / Results  Labs (all labs ordered are listed, but only abnormal results are displayed) Labs Reviewed - No data to display  EKG   Radiology No results found.  Procedures Procedures (including critical care time)  Medications Ordered in UC Medications - No data to display  Initial Impression / Assessment and Plan / UC Course  I have reviewed the triage vital signs and the nursing notes.  Pertinent labs & imaging results that were available during my care of the patient were reviewed by  me and considered in my medical decision making (see chart for details).    1.  Plantar warts: Patient advised to apply salicylic acid twice daily to the area involved. Patient is advised to wear well-padded shoes to help with pain He may take Tylenol or ibuprofen as needed for pain If symptoms worsen patient will need to see a podiatrist for further management. Final Clinical Impressions(s) / UC Diagnoses   Final diagnoses:  Plantar warts     Discharge Instructions      Please soak your feet in water for about 10 to 15 minutes, dry the area and then apply the medications prescribed. You may take ibuprofen as needed for pain Please wear well-padded shoes to help reduce the level of pain If you have persistent pain or worsening symptoms, you will need to see a podiatrist for further management. Feel free to return to urgent care if you have worsening symptoms.     ED Prescriptions     Medication Sig Dispense Auth. Provider   Salicylic Acid 27.5 % LIQD Please apply solution to the affected area twice daily. 10 mL Michaelanthony Kempton, Britta Mccreedy, MD      PDMP not reviewed this encounter.   Merrilee Jansky, MD 09/24/23 [phone]

## 2023-12-22 IMAGING — DX DG ANKLE COMPLETE 3+V*L*
3 series · 3 of 3 positions shown · non-contrast
Comparison: None Available.

CLINICAL DATA: Fall, pain to lateral ankle and foot

EXAM:
LEFT ANKLE COMPLETE - 3+ VIEW; LEFT FOOT - COMPLETE 3+ VIEW

[ankle ap]
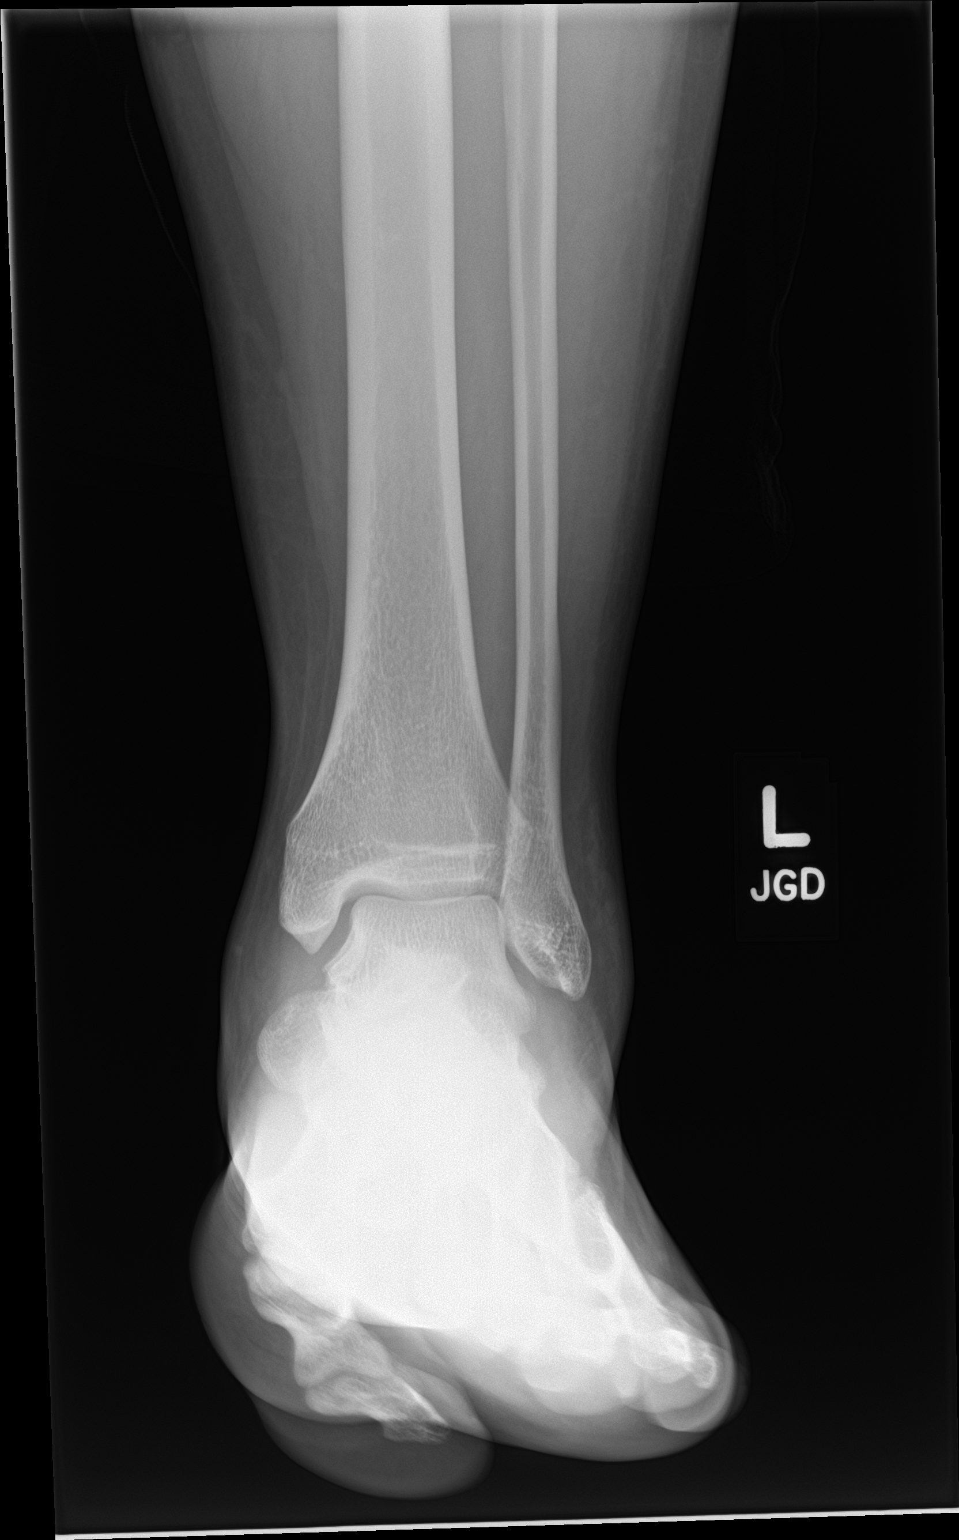

[ankle obl]
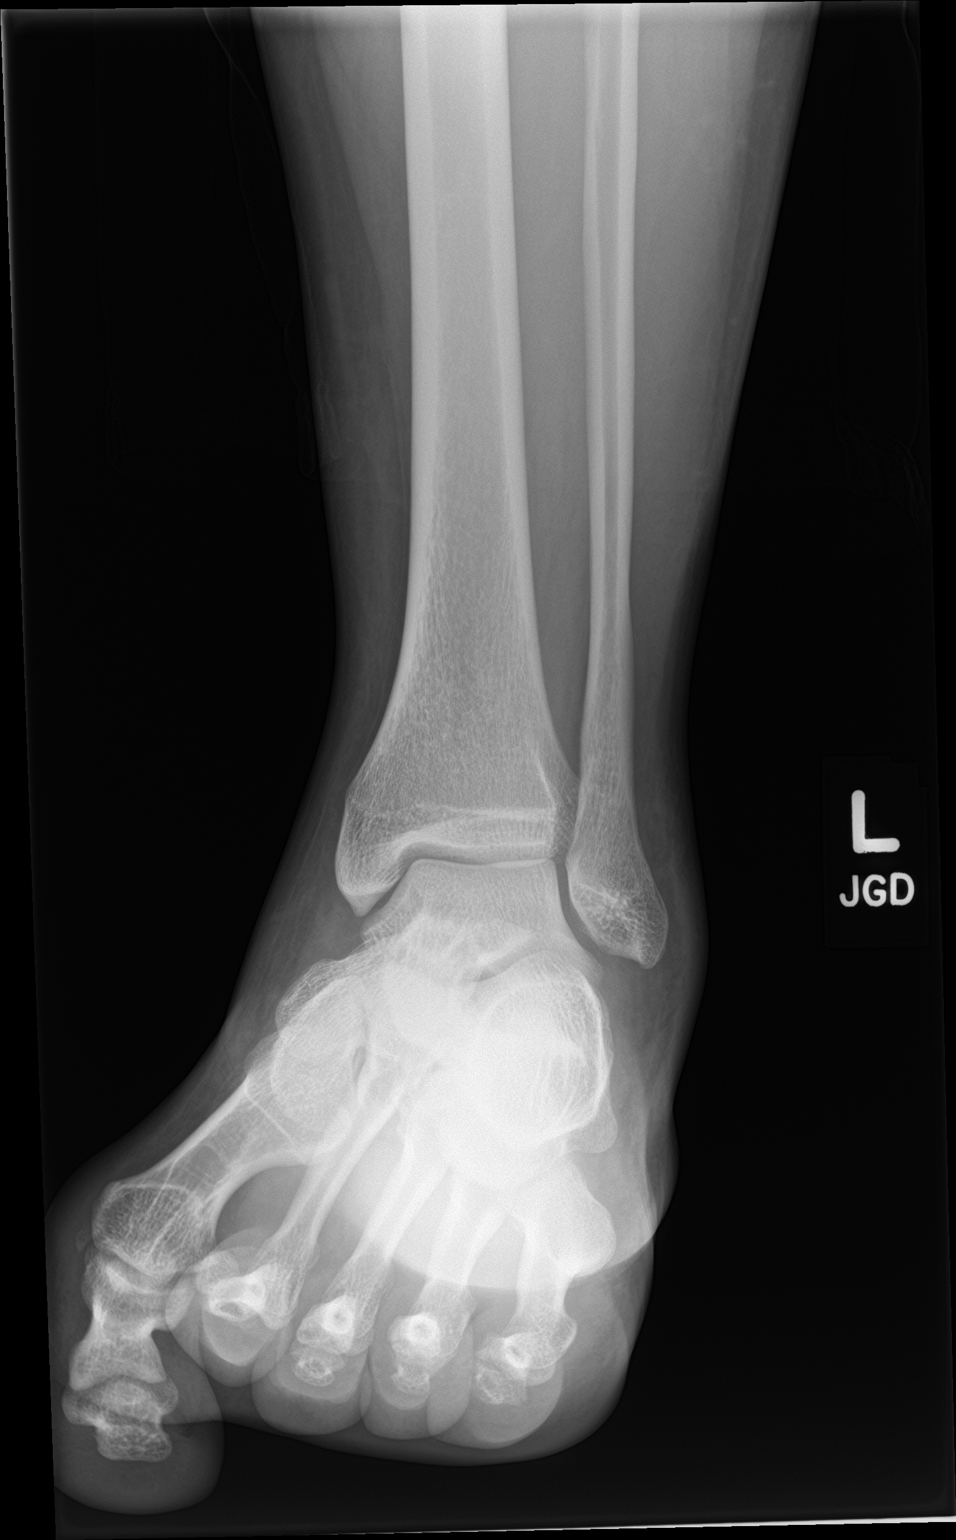

[ankle lat]
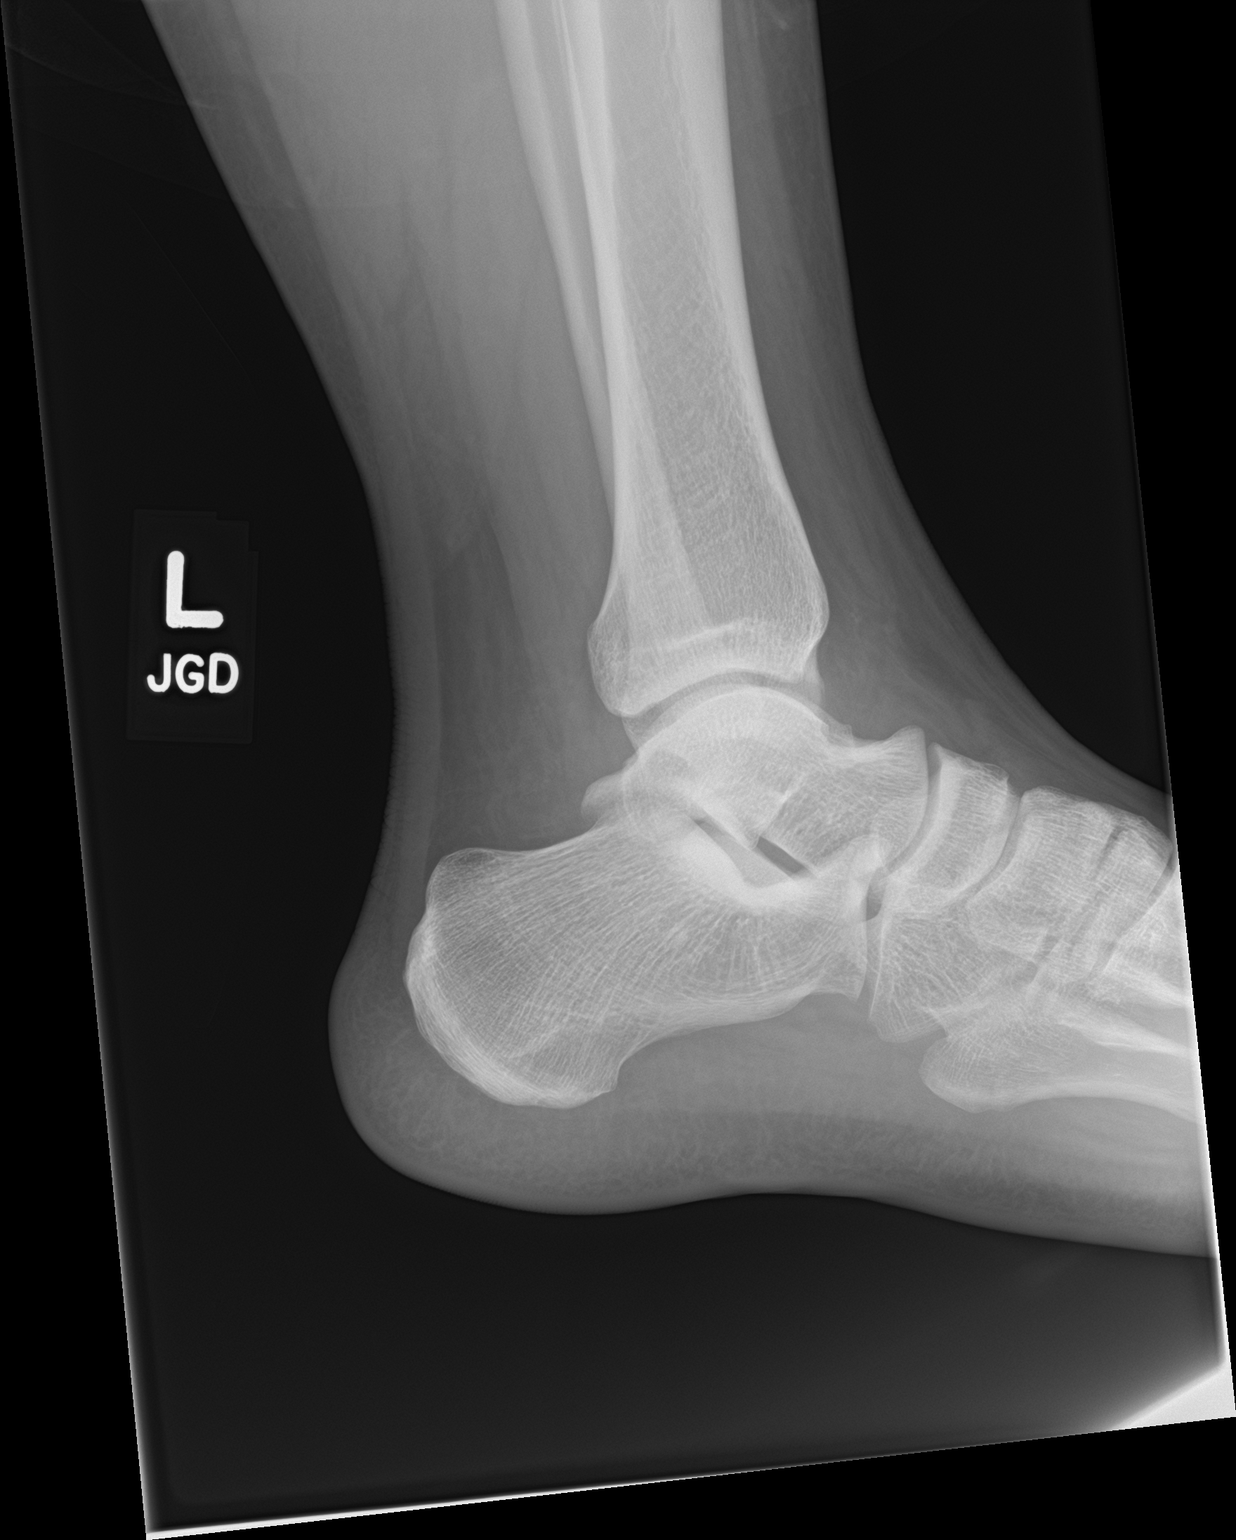

[3 of 3 positions shown; findings below may reference images not displayed]

FINDINGS: Ankle: There is no acute fracture or dislocation. Alignment is
normal. The ankle mortise is intact. There is no erosive change.
There is mild soft tissue swelling over the lateral malleolus.

Foot: There is no acute fracture or dislocation. Alignment is
normal. The Lisfranc and Chopart joints are intact. The joint spaces
are preserved. There is no erosive change. The soft tissues are
unremarkable.
IMPRESSION: Mild soft tissue swelling over the lateral malleolus. No acute
fracture or dislocation in the ankle or foot.

## 2024-01-17 ENCOUNTER — Emergency Department (HOSPITAL_COMMUNITY)
Admission: EM | Admit: 2024-01-17 | Discharge: 2024-01-18 | Disposition: A | Discharge status: Home / Self Care | Payer: Self-pay | Attending: Emergency Medicine | Admitting: Emergency Medicine

## 2024-01-17 ENCOUNTER — Other Ambulatory Visit: Payer: Self-pay

## 2024-01-17 DIAGNOSIS — M7989 Other specified soft tissue disorders: Secondary | ICD-10-CM | POA: Insufficient documentation

## 2024-01-17 DIAGNOSIS — X501XXA Overexertion from prolonged static or awkward postures, initial encounter: Secondary | ICD-10-CM | POA: Insufficient documentation

## 2024-01-17 DIAGNOSIS — M25561 Pain in right knee: Secondary | ICD-10-CM

## 2024-01-17 DIAGNOSIS — S8001XA Contusion of right knee, initial encounter: Secondary | ICD-10-CM | POA: Insufficient documentation

## 2024-01-17 MED ORDER — ACETAMINOPHEN 325 MG PO TABS
650.0000 mg | ORAL_TABLET | Freq: Once | ORAL | Status: AC | PRN
  Administered 2024-01-18: 650 mg via ORAL
  Filled 2024-01-17: qty 2

## 2024-01-17 NOTE — ED Triage Notes (Signed)
 Pt reports right knee pain over the past week. Sts he went to get up and hear a pop. Feels like theres increased swelling in his right knee. Ambulatory in triage.

## 2024-01-18 ENCOUNTER — Other Ambulatory Visit (HOSPITAL_COMMUNITY): Payer: Self-pay

## 2024-01-18 ENCOUNTER — Emergency Department (HOSPITAL_COMMUNITY): Payer: Self-pay

## 2024-01-18 NOTE — Discharge Instructions (Addendum)
 I am suspicious that you may have a sprain, versus a tear, of some of the ligaments in your leg.  Please wear the knee immobilizer at all times, and follow-up with orthopedics, as instructed.  Take Tylenol and ibuprofen for your pain control.  Return to the ER if you have any loss of sensation in your foot or if it becomes pale

## 2024-01-18 NOTE — ED Provider Notes (Signed)
 [place] EMERGENCY DEPARTMENT AT [facility] Provider Note   CSN: [id number] Arrival date & time: 01/17/24  2343     History  Chief Complaint  Patient presents with   Knee Pain    R    Jon Short is a 23 y.o. male, no pertinent past medical history, who presents to the ED 2/2 to the pain has been going on for the last week.  He states his right knee, and he was sitting crisscross applesauce, when he stood up, and heard a severe pop.  Since then has been having difficulty bearing weight, and has been favoring the other leg.  Notes that he has bruising, and feels a click every time he walks.  Has tried Tylenol , but states the pain is persistent so decided to come to the ER.  Denies any changes in the color of his foot, but states that there is a strange bruise on his knee.     Home Medications Prior to Admission medications   Medication Sig Start Date End Date Taking? Authorizing Provider  hydrOXYzine  (ATARAX ) 10 MG tablet Take 1 tablet (10 mg total) by mouth 3 (three) times daily as needed. Patient not taking: Reported on 09/24/2023 05/30/23   Arlyne Bering, NP  ibuprofen  (ADVIL ) 800 MG tablet Take 1 tablet (800 mg total) by mouth 3 (three) times daily. 07/25/22   Piontek, Cleveland Dales, MD  lurasidone  (LATUDA ) 20 MG TABS tablet Take 1 tablet (20 mg total) by mouth at bedtime. 05/30/23   Arlyne Bering, NP  Salicylic Acid  27.5 % LIQD Please apply solution to the affected area twice daily. 09/24/23   LampteyDonley Furth, MD  tiZANidine  (ZANAFLEX ) 4 MG tablet Take 1 tablet (4 mg total) by mouth every 6 (six) hours as needed for muscle spasms. Patient not taking: Reported on 09/24/2023 07/25/22   Lesle Ras, MD  traZODone  (DESYREL ) 50 MG tablet Take 1 tablet (50 mg total) by mouth at bedtime. Patient not taking: Reported on 09/24/2023 05/30/23   Arlyne Bering, NP      Allergies    Patient has no known allergies.    Review of Systems   Review of Systems   Musculoskeletal:  Positive for arthralgias.  Skin:  Negative for color change.    Physical Exam Updated Vital Signs BP (!) 123/91 (BP Location: Left Arm)   Pulse 96   Temp 98.5 F (36.9 C) (Oral)   Resp 14   SpO2 100%  Physical Exam Vitals and nursing note reviewed.  Constitutional:      General: He is not in acute distress.    Appearance: He is well-developed.  HENT:     Head: Normocephalic and atraumatic.  Eyes:     General:        Right eye: No discharge.        Left eye: No discharge.     Conjunctiva/sclera: Conjunctivae normal.  Pulmonary:     Effort: No respiratory distress.  Musculoskeletal:     Comments: Right Knee: Tenderness to palpation of medial joint line as well as posteromedial knee. An effusion is not present, but there is ecchymoses present to medial knee.  Negative anterior and posterior drawer.  +Patellar stability. Extension and flexion intact, but clicking present when internally when extending and flexing knee. No sensory deficits. +dorsalis pedis pulse    Neurological:     Mental Status: He is alert.     Comments: Clear speech.   Psychiatric:  Behavior: Behavior normal.        Thought Content: Thought content normal.     ED Results / Procedures / Treatments   Labs (all labs ordered are listed, but only abnormal results are displayed) Labs Reviewed - No data to display  EKG None  Radiology DG Knee Complete 4 Views Right Result Date: 01/18/2024 CLINICAL DATA:  Right knee swelling EXAM: RIGHT KNEE - COMPLETE 4+ VIEW COMPARISON:  None Available. FINDINGS: No evidence of fracture, dislocation, or joint effusion. No evidence of arthropathy or other focal bone abnormality. Soft tissues are unremarkable. IMPRESSION: Negative. Electronically Signed   By: Worthy Heads M.D.   On: 01/18/2024 00:19    Procedures Procedures    Medications Ordered in ED Medications  acetaminophen  (TYLENOL ) tablet 650 mg (650 mg Oral Given 01/18/24 0000)     ED Course/ Medical Decision Making/ A&P                                 Medical Decision Making Patient is a 23 year old male, here with bruising to the medial right knee, as well as clicking when he moves it, and difficulty bearing weight after standing up abruptly.  He does have bruising present, clicking of the knee, with flexion and extension, and pain, upon palpation to the both the posterior medial, and the medial aspect of the leg.  He has no effusion noted.  I am suspicious for possible MCL versus medial meniscus tear, will obtain x-ray, to rule any Fractures.  Amount and/or Complexity of Data Reviewed Radiology: ordered.    Details: X-ray unremarkable Discussion of management or test interpretation with external provider(s): X-rays unremarkable, suspicious for meniscal tear versus a ligament sprain versus tear.  Will have him follow-up with orthopedics, I placed him in a knee immobilizer, and crutches, and instructed him to follow-up with orthopedics.  Discussed emergent return precautions, and he voiced understanding.  Tylenol  and ibuprofen  for pain control  Risk OTC drugs.    Final Clinical Impression(s) / ED Diagnoses Final diagnoses:  Acute pain of right knee    Rx / DC Orders ED Discharge Orders     None         Burns Carwin, Dwaine Gip, PA 01/18/24 0048    Lindle Rhea, MD 01/19/24 [phone]

## 2024-02-05 ENCOUNTER — Emergency Department (HOSPITAL_COMMUNITY)
Admission: EM | Admit: 2024-02-05 | Discharge: 2024-02-05 | Disposition: A | Discharge status: Home / Self Care | Payer: Self-pay | Attending: Emergency Medicine | Admitting: Emergency Medicine

## 2024-02-05 ENCOUNTER — Emergency Department (HOSPITAL_COMMUNITY): Payer: Self-pay

## 2024-02-05 ENCOUNTER — Encounter (HOSPITAL_COMMUNITY): Payer: Self-pay

## 2024-02-05 ENCOUNTER — Other Ambulatory Visit: Payer: Self-pay

## 2024-02-05 DIAGNOSIS — S299XXA Unspecified injury of thorax, initial encounter: Secondary | ICD-10-CM | POA: Insufficient documentation

## 2024-02-05 DIAGNOSIS — M25512 Pain in left shoulder: Secondary | ICD-10-CM | POA: Insufficient documentation

## 2024-02-05 DIAGNOSIS — R519 Headache, unspecified: Secondary | ICD-10-CM | POA: Diagnosis not present

## 2024-02-05 DIAGNOSIS — R0789 Other chest pain: Secondary | ICD-10-CM | POA: Insufficient documentation

## 2024-02-05 DIAGNOSIS — Y92410 Unspecified street and highway as the place of occurrence of the external cause: Secondary | ICD-10-CM | POA: Insufficient documentation

## 2024-02-05 MED ORDER — ACETAMINOPHEN 500 MG PO TABS
1000.0000 mg | ORAL_TABLET | Freq: Once | ORAL | Status: AC
  Administered 2024-02-05: 1000 mg via ORAL
  Filled 2024-02-05: qty 2

## 2024-02-05 MED ORDER — METHOCARBAMOL 500 MG PO TABS
500.0000 mg | ORAL_TABLET | Freq: Once | ORAL | Status: AC
  Administered 2024-02-05: 500 mg via ORAL
  Filled 2024-02-05: qty 1

## 2024-02-05 MED ORDER — METHOCARBAMOL 500 MG PO TABS: 500.0000 mg | ORAL_TABLET | Freq: Two times a day (BID) | ORAL | 0 refills | Status: AC

## 2024-02-05 MED ORDER — KETOROLAC TROMETHAMINE 15 MG/ML IJ SOLN
15.0000 mg | Freq: Once | INTRAMUSCULAR | Status: AC
  Administered 2024-02-05: 15 mg via INTRAMUSCULAR
  Filled 2024-02-05: qty 1

## 2024-02-05 MED ORDER — ETODOLAC 400 MG PO TABS: 400.0000 mg | ORAL_TABLET | Freq: Two times a day (BID) | ORAL | 0 refills | Status: AC
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
